# Patient Record
Sex: Male | Born: 1975 | Race: White | Hispanic: No | Marital: Single | Smoking: Current every day smoker
Health system: Southern US, Community
[De-identification: ages and names within clinical notes are randomized; demographics above are authoritative.]

## PROBLEM LIST (undated history)

## (undated) DIAGNOSIS — G5600 Carpal tunnel syndrome, unspecified upper limb: Secondary | ICD-10-CM

---

## 2005-10-23 ENCOUNTER — Emergency Department: Payer: Self-pay | Admitting: General Practice

## 2006-04-16 ENCOUNTER — Inpatient Hospital Stay: Payer: Self-pay | Admitting: Internal Medicine

## 2015-02-02 ENCOUNTER — Emergency Department: Payer: No Typology Code available for payment source

## 2015-02-02 ENCOUNTER — Emergency Department
Admission: EM | Admit: 2015-02-02 | Discharge: 2015-02-02 | Disposition: A | Discharge status: Home / Self Care | Payer: No Typology Code available for payment source | Attending: Emergency Medicine | Admitting: Emergency Medicine

## 2015-02-02 DIAGNOSIS — Y998 Other external cause status: Secondary | ICD-10-CM | POA: Insufficient documentation

## 2015-02-02 DIAGNOSIS — S12200A Unspecified displaced fracture of third cervical vertebra, initial encounter for closed fracture: Secondary | ICD-10-CM | POA: Diagnosis not present

## 2015-02-02 DIAGNOSIS — Y9389 Activity, other specified: Secondary | ICD-10-CM | POA: Diagnosis not present

## 2015-02-02 DIAGNOSIS — Z720 Tobacco use: Secondary | ICD-10-CM | POA: Insufficient documentation

## 2015-02-02 DIAGNOSIS — S129XXA Fracture of neck, unspecified, initial encounter: Secondary | ICD-10-CM

## 2015-02-02 DIAGNOSIS — S3992XA Unspecified injury of lower back, initial encounter: Secondary | ICD-10-CM | POA: Insufficient documentation

## 2015-02-02 DIAGNOSIS — Y92410 Unspecified street and highway as the place of occurrence of the external cause: Secondary | ICD-10-CM | POA: Insufficient documentation

## 2015-02-02 DIAGNOSIS — S199XXA Unspecified injury of neck, initial encounter: Secondary | ICD-10-CM | POA: Diagnosis present

## 2015-02-02 HISTORY — DX: Carpal tunnel syndrome, unspecified upper limb: G56.00

## 2015-02-02 MED ORDER — ORPHENADRINE CITRATE 30 MG/ML IJ SOLN
INTRAMUSCULAR | Status: AC
  Administered 2015-02-02: 60 mg via INTRAMUSCULAR
  Filled 2015-02-02: qty 2

## 2015-02-02 MED ORDER — KETOROLAC TROMETHAMINE 60 MG/2ML IM SOLN
INTRAMUSCULAR | Status: AC
  Administered 2015-02-02: 60 mg via INTRAMUSCULAR
  Filled 2015-02-02: qty 2

## 2015-02-02 MED ORDER — HYDROMORPHONE HCL 1 MG/ML IJ SOLN
INTRAMUSCULAR | Status: AC
  Administered 2015-02-02: 1 mg via INTRAMUSCULAR
  Filled 2015-02-02: qty 1

## 2015-02-02 MED ORDER — ORPHENADRINE CITRATE 30 MG/ML IJ SOLN
60.0000 mg | Freq: Two times a day (BID) | INTRAMUSCULAR | Status: DC
  Administered 2015-02-02: 60 mg via INTRAMUSCULAR

## 2015-02-02 MED ORDER — HYDROMORPHONE HCL 1 MG/ML IJ SOLN
1.0000 mg | Freq: Once | INTRAMUSCULAR | Status: AC
  Administered 2015-02-02: 1 mg via INTRAMUSCULAR

## 2015-02-02 MED ORDER — OXYCODONE-ACETAMINOPHEN 7.5-325 MG PO TABS: 1.0000 | ORAL_TABLET | Freq: Four times a day (QID) | ORAL | Status: AC | PRN

## 2015-02-02 MED ORDER — KETOROLAC TROMETHAMINE 60 MG/2ML IM SOLN
60.0000 mg | Freq: Once | INTRAMUSCULAR | Status: AC
  Administered 2015-02-02: 60 mg via INTRAMUSCULAR

## 2015-02-02 NOTE — ED Notes (Signed)
Pt informed to return if any life threatening symptoms occur.  Pt family informed to return with patient if any life threatening symptoms occur.
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
## 2015-02-02 NOTE — ED Notes (Signed)
Pt remains in ccollar, waiting on response from neurosurgeon

## 2015-02-02 NOTE — ED Notes (Signed)
Outside vendor brace placed by outside vendor. Sister at bedside to review discharge papers with. Verbalized understanding of need to wear brace and importance of following up with neurology.

## 2015-02-09 ENCOUNTER — Telehealth: Payer: Self-pay | Admitting: Emergency Medicine

## 2015-03-30 ENCOUNTER — Other Ambulatory Visit: Payer: Self-pay | Admitting: Neurosurgery

## 2015-03-30 DIAGNOSIS — S12201A Unspecified nondisplaced fracture of third cervical vertebra, initial encounter for closed fracture: Secondary | ICD-10-CM
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
